# Patient Record
Sex: Female | Born: 1995 | Race: Black or African American | Hispanic: No | Marital: Single | Smoking: Current every day smoker
Health system: Southern US, Community
[De-identification: ages and names within clinical notes are randomized; demographics above are authoritative.]

---

## 2017-03-22 ENCOUNTER — Other Ambulatory Visit: Payer: Self-pay

## 2017-03-22 ENCOUNTER — Emergency Department (HOSPITAL_COMMUNITY)
Admission: EM | Admit: 2017-03-22 | Discharge: 2017-03-23 | Disposition: A | Discharge status: Home / Self Care | Payer: Self-pay | Attending: Emergency Medicine | Admitting: Emergency Medicine

## 2017-03-22 ENCOUNTER — Emergency Department (HOSPITAL_COMMUNITY): Payer: Self-pay

## 2017-03-22 ENCOUNTER — Encounter (HOSPITAL_COMMUNITY): Payer: Self-pay | Admitting: *Deleted

## 2017-03-22 DIAGNOSIS — F17210 Nicotine dependence, cigarettes, uncomplicated: Secondary | ICD-10-CM | POA: Insufficient documentation

## 2017-03-22 DIAGNOSIS — R062 Wheezing: Secondary | ICD-10-CM

## 2017-03-22 DIAGNOSIS — J209 Acute bronchitis, unspecified: Secondary | ICD-10-CM | POA: Insufficient documentation

## 2017-03-22 DIAGNOSIS — R000 Tachycardia, unspecified: Secondary | ICD-10-CM | POA: Insufficient documentation

## 2017-03-22 LAB — POC URINE PREG, ED: PREG TEST UR: NEGATIVE

## 2017-03-22 MED ORDER — ALBUTEROL SULFATE HFA 108 (90 BASE) MCG/ACT IN AERS
2.0000 | INHALATION_SPRAY | Freq: Once | RESPIRATORY_TRACT | Status: AC
  Administered 2017-03-22: 2 via RESPIRATORY_TRACT
  Filled 2017-03-22: qty 6.7

## 2017-03-22 MED ORDER — BENZONATATE 100 MG PO CAPS
200.0000 mg | ORAL_CAPSULE | Freq: Once | ORAL | Status: AC
  Administered 2017-03-22: 200 mg via ORAL
  Filled 2017-03-22: qty 2

## 2017-03-22 MED ORDER — BENZONATATE 100 MG PO CAPS: 200.0000 mg | ORAL_CAPSULE | Freq: Three times a day (TID) | ORAL | 0 refills | Status: AC | PRN

## 2017-03-22 NOTE — Discharge Instructions (Signed)
Continue to use the inhaler given for coughing, wheezing and shortness of breath. As discussed, return here for a recheck if your symptoms persist or worsen, especially for any worsening shortness of breath or if your fast heart rate persists.  We recommended blood tests to make sure you do not have a blood clot in your lungs causing your symptoms but you have refused this test. Please return at any time to complete this work up if you decide to have this done.

## 2017-03-22 NOTE — ED Provider Notes (Signed)
[facility] EMERGENCY DEPARTMENT Provider Note   CSN: [id number] Arrival date & time: 03/22/17  1957     History   Chief Complaint Chief Complaint  Patient presents with  . Cough    HPI Lauren Mills is a 22 y.o. female presenting a nonproductive cough associated with shortness of breath which worsens in the evening and has been associated with wheezing and palpitations.  She endorses subjective fever, stating she has felt chilled with occasional hot flushes, also endorses generalized body aches. She has been exposed to family members with similar symptoms. She endorses mid sternal burning chest pain which is triggered by coughing only.   She is a 1 ppd smoker.  She also reports having abdominal bloating and discomfort yesterday which is gone today. She had a hard stool yesterday, but again, this sx is better today.  She took 2 home pregnancy tests within the past week, 1 negative and one equivocal and is concerned about possible pregnancy. Her LMP was 2/28 and heavier than normal. She denies dysuria, hematuria, vaginal dc. She has had no treatments prior to arrival.  The history is provided by the patient.    History reviewed. No pertinent past medical history.  There are no active problems to display for this patient.   History reviewed. No pertinent surgical history.  OB History    No data available       Home Medications    Prior to Admission medications   Medication Sig Start Date End Date Taking? Authorizing Provider  benzonatate (TESSALON) 100 MG capsule Take 2 capsules (200 mg total) by mouth 3 (three) times daily as needed for cough. 03/22/17   Burgess Amor, PA-C    Family History History reviewed. No pertinent family history.  Social History Social History   Tobacco Use  . Smoking status: Current Every Day Smoker    Packs/day: 0.50    Types: Cigarettes  . Smokeless tobacco: Never Used  Substance Use Topics  . Alcohol use: No    Frequency: Never  . Drug  use: No     Allergies   Patient has no known allergies.   Review of Systems Review of Systems  Constitutional: Positive for chills and fever.  HENT: Negative for congestion, ear pain, rhinorrhea, sinus pressure, sore throat, trouble swallowing and voice change.   Eyes: Negative for discharge.  Respiratory: Positive for cough, shortness of breath and wheezing. Negative for stridor.   Cardiovascular: Negative for chest pain.  Gastrointestinal: Positive for abdominal distention and constipation. Negative for abdominal pain, diarrhea, nausea and vomiting.  Genitourinary: Negative.  Negative for dysuria and vaginal discharge.  Musculoskeletal: Negative.   Skin: Negative.      Physical Exam Updated Vital Signs BP (!) 132/94   Pulse (!) 120   Temp 98.7 F (37.1 C) (Oral)   Resp (!) 22   Ht 6\' 2"  (1.88 m)   Wt 62.6 kg (138 lb)   LMP 03/15/2017   SpO2 100%   BMI 17.72 kg/m   Physical Exam  Constitutional: She appears well-developed and well-nourished.  HENT:  Head: Normocephalic and atraumatic.  Eyes: Conjunctivae are normal.  Neck: Normal range of motion.  Cardiovascular: Regular rhythm, normal heart sounds and intact distal pulses. Tachycardia present.  Pulses:      Radial pulses are 2+ on the right side, and 2+ on the left side.  Pulmonary/Chest: Effort normal. She has no decreased breath sounds. She has wheezes in the right middle field and the right lower field.  She has no rhonchi. She has no rales.  Abdominal: Soft. Bowel sounds are normal. She exhibits no distension, no fluid wave and no mass. There is no tenderness. There is no rigidity, no rebound, no guarding and no CVA tenderness.  Musculoskeletal: Normal range of motion.  Neurological: She is alert.  Skin: Skin is warm and dry.  Psychiatric: She has a normal mood and affect.  Nursing note and vitals reviewed.    ED Treatments / Results  Labs (all labs ordered are listed, but only abnormal results are  displayed) Labs Reviewed  POC URINE PREG, ED  POC URINE PREG, ED    EKG  EKG Interpretation  Date/Time:  Wednesday March 22 2017 23:13:32 EDT Ventricular Rate:  110 PR Interval:  142 QRS Duration: 82 QT Interval:  324 QTC Calculation: 438 R Axis:   118 Text Interpretation:  Sinus tachycardia Possible Left atrial enlargement Right axis deviation Possible Right ventricular hypertrophy Abnormal ECG No previous ECGs available Confirmed by Glynn Octaveancour, Stephen [phone]([id number]) on 03/22/2017 11:23:26 PM       Radiology Dg Chest 2 View  Result Date: 03/22/2017 CLINICAL DATA:  22 year old female with cough. EXAM: CHEST - 2 VIEW COMPARISON:  None. FINDINGS: The heart size and mediastinal contours are within normal limits. Both lungs are clear. The visualized skeletal structures are unremarkable. IMPRESSION: No active cardiopulmonary disease. Electronically Signed   By: Elgie CollardArash  Radparvar M.D.   On: 03/22/2017 21:44    Procedures Procedures (including critical care time)  Medications Ordered in ED Medications  albuterol (PROVENTIL HFA;VENTOLIN HFA) 108 (90 Base) MCG/ACT inhaler 2 puff (2 puffs Inhalation Given 03/22/17 2221)  benzonatate (TESSALON) capsule 200 mg (200 mg Oral Given 03/22/17 2240)     Initial Impression / Assessment and Plan / ED Course  I have reviewed the triage vital signs and the nursing notes.  Pertinent labs & imaging results that were available during my care of the patient were reviewed by me and considered in my medical decision making (see chart for details).     POC urine preg negative, did not cross over into the chart.  Pt refused blood draw, reports she gets panic attacks around needles.  Discussed reasons for wanting to check d dimer to rule out PE given persistent tachycardia and potential for life threatening condition if she has this diagnosis.  She has no risk factors for a PE, no peripheral exam findings to suggest dvt, but she has remained tachycardic during her  visit (and before she was given albuterol).  Pt still refused blood testing but states would return if sx worsened.  Her wheezing was improved after receiving albuterol mdi, instructed in its use. Prescribed tessalon perles.  Findings most suggesting viral uri/bronchitis with bronchospasm but with persistent tachycardia. .  Advised to return for any worsened or persistent sx.    Pt was signed out ama.         Final Clinical Impressions(s) / ED Diagnoses   Final diagnoses:  Acute bronchitis, unspecified organism  Wheezing  Tachycardia    ED Discharge Orders        Ordered    benzonatate (TESSALON) 100 MG capsule  3 times daily PRN     03/22/17 2355       Burgess Amordol, Nandita Mathenia, PA-C 03/23/17 0231    Glynn Octaveancour, Stephen, MD 03/23/17 [id number]

## 2017-03-22 NOTE — ED Notes (Addendum)
Patient transported to X-ray ambulatory 

## 2017-03-22 NOTE — ED Triage Notes (Signed)
Pt c/o cough and fever x 3 days ago

## 2019-01-28 IMAGING — DX DG CHEST 2V
2 series · 2 of 2 positions shown · non-contrast
Comparison: None.

CLINICAL DATA: 21-year-old female with cough.

EXAM:
CHEST - 2 VIEW

[chest pa]
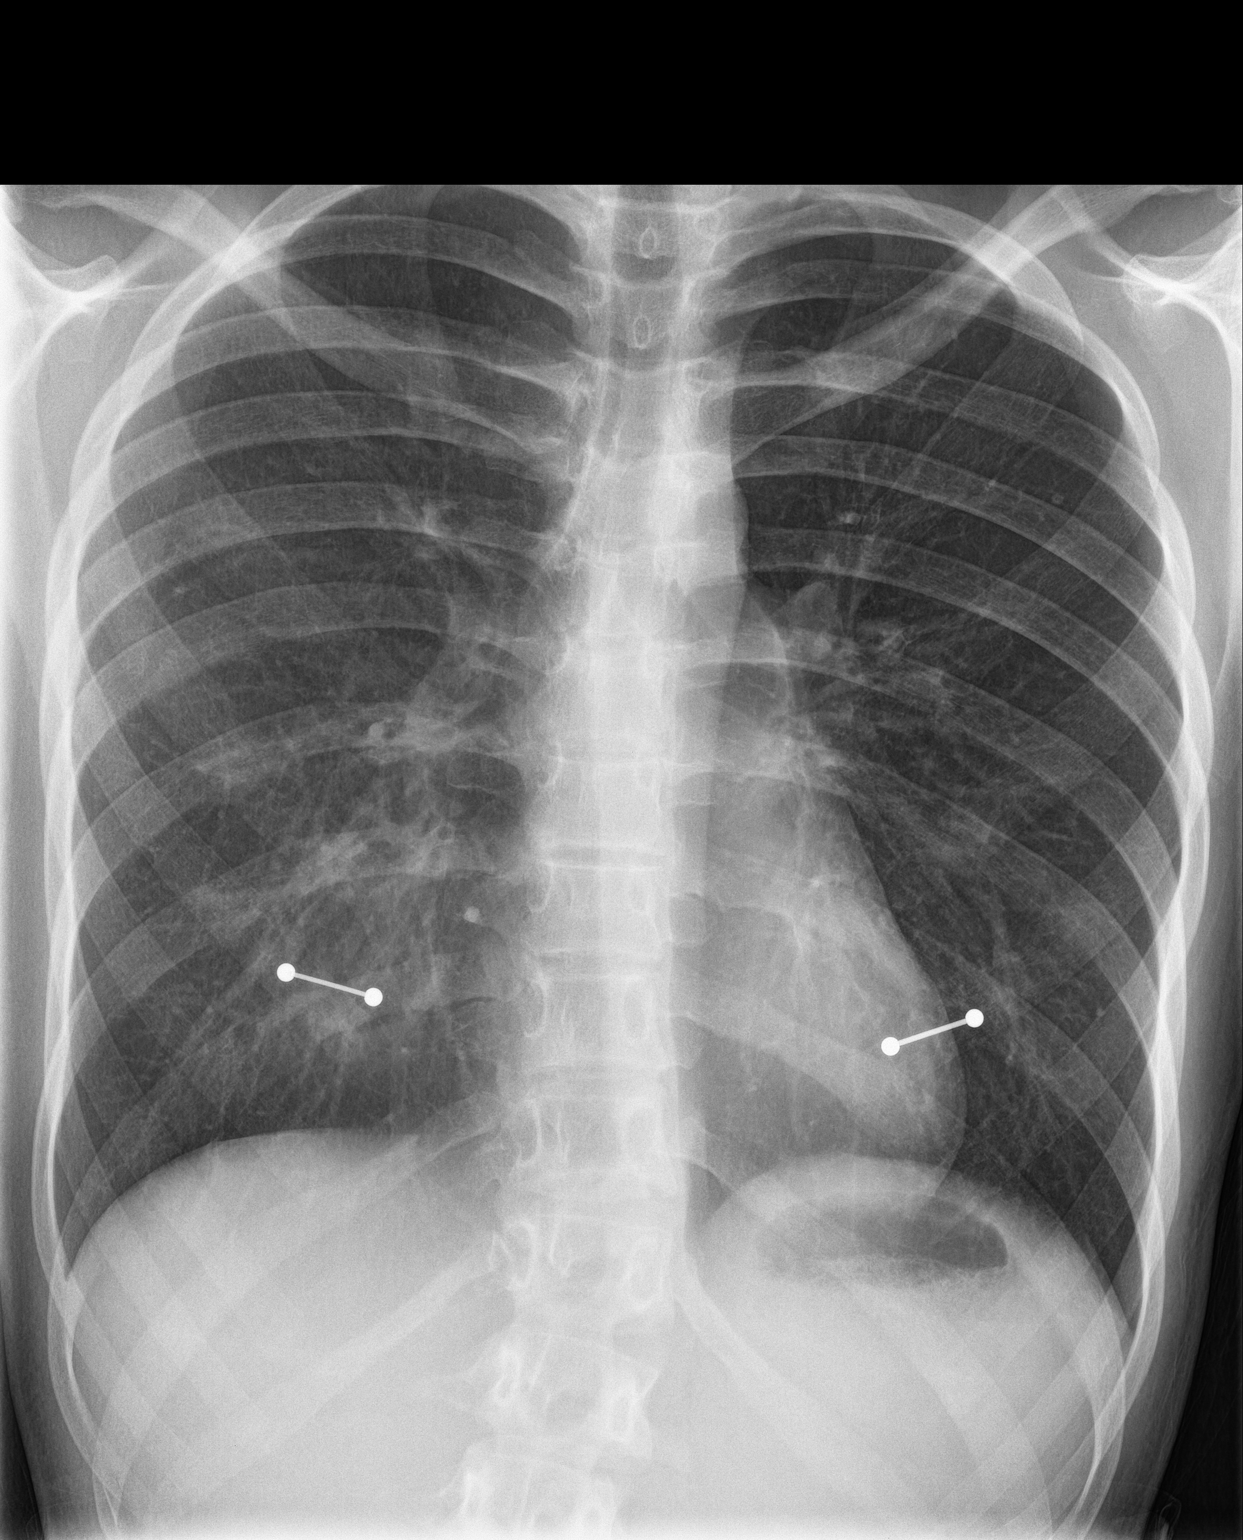

[chest lat]
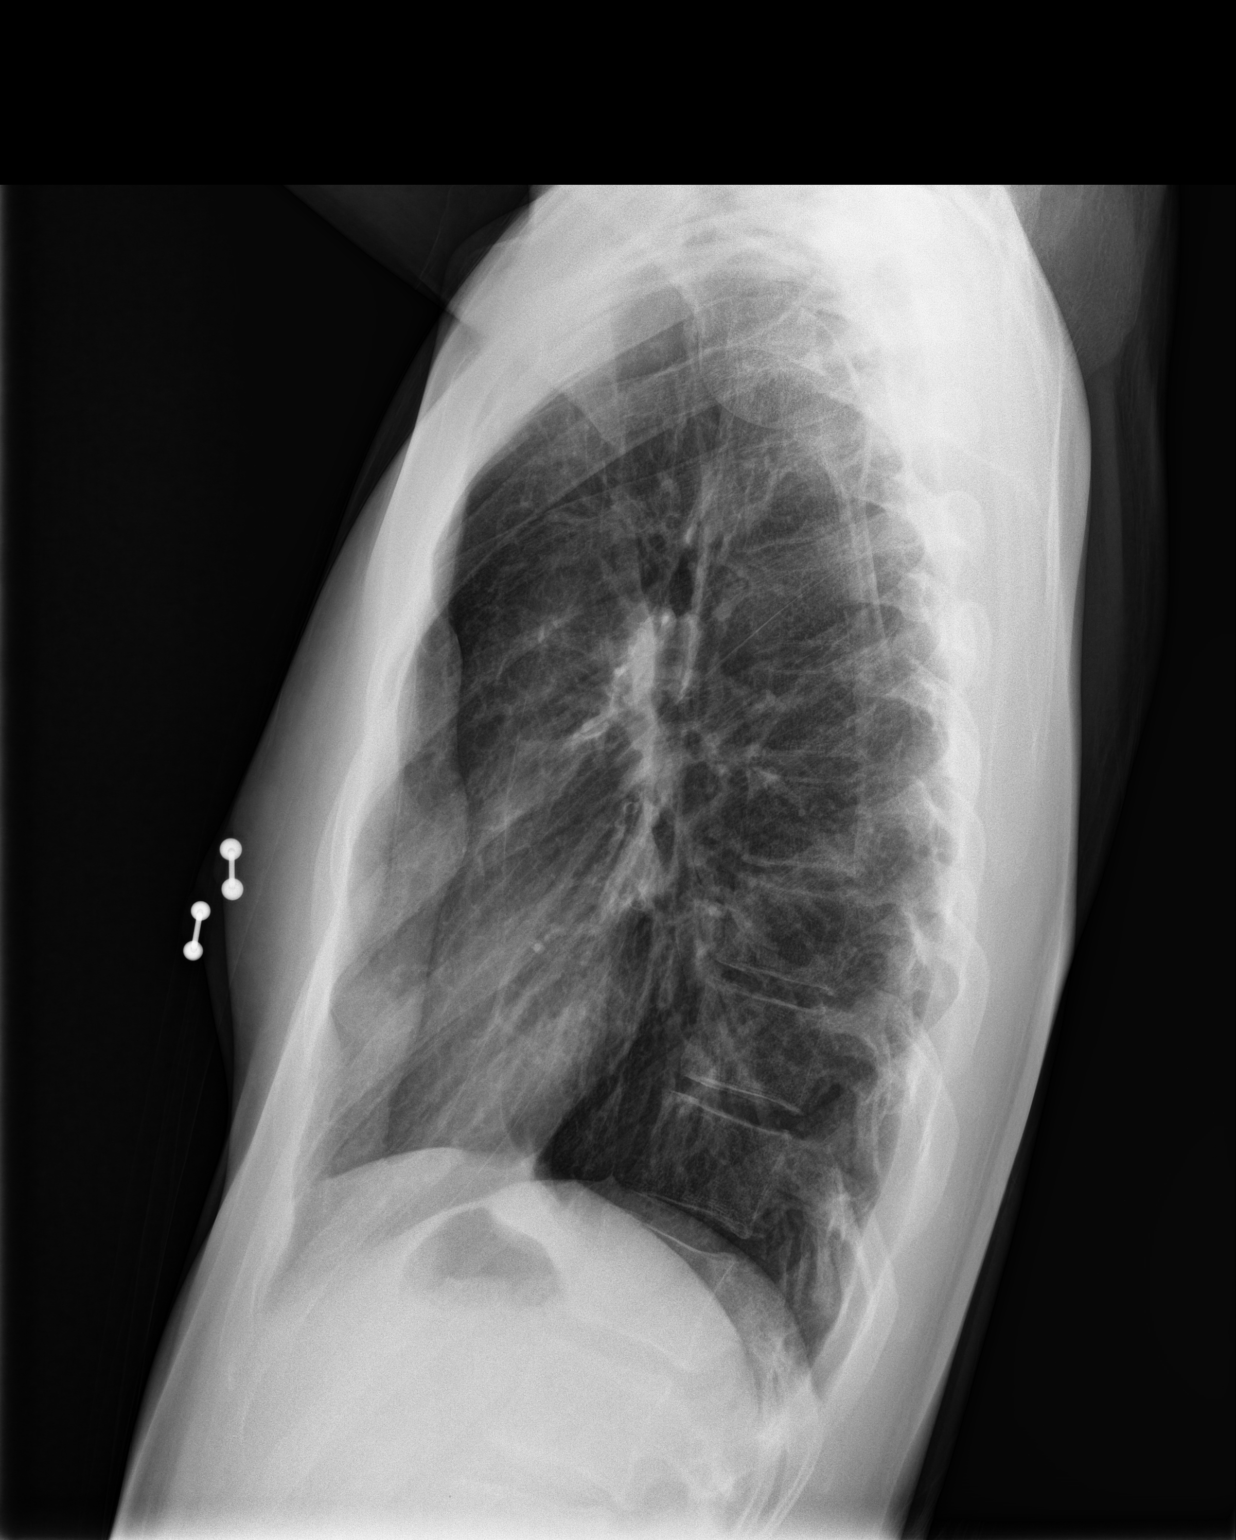

[2 of 2 positions shown; findings below may reference images not displayed]

FINDINGS: The heart size and mediastinal contours are within normal limits.
Both lungs are clear. The visualized skeletal structures are
unremarkable.
IMPRESSION: No active cardiopulmonary disease.
